# Patient Record
Sex: Female | Born: 1985 | Hispanic: No | Marital: Single | Smoking: Never smoker
Health system: Southern US, Community
[De-identification: ages and names within clinical notes are randomized; demographics above are authoritative.]

---
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
## 2011-06-02 ENCOUNTER — Emergency Department: Payer: Self-pay | Admitting: Internal Medicine

## 2012-12-06 ENCOUNTER — Emergency Department: Payer: Self-pay | Admitting: Emergency Medicine

## 2012-12-06 LAB — COMPREHENSIVE METABOLIC PANEL
Albumin: 3.4 g/dL (ref 3.4–5.0)
Alkaline Phosphatase: 53 U/L (ref 50–136)
BUN: 8 mg/dL (ref 7–18)
Co2: 23 mmol/L (ref 21–32)
Creatinine: 0.41 mg/dL — ABNORMAL LOW (ref 0.60–1.30)
SGPT (ALT): 18 U/L (ref 12–78)

## 2012-12-06 LAB — URINALYSIS, COMPLETE
Bilirubin,UR: NEGATIVE
Glucose,UR: NEGATIVE mg/dL (ref 0–75)
Ph: 6 (ref 4.5–8.0)
RBC,UR: <1 /HPF (ref 0–5)
Specific Gravity: 1.01 (ref 1.003–1.030)
WBC UR: 2 /HPF (ref 0–5)

## 2012-12-06 LAB — CBC
HGB: 12 g/dL (ref 12.0–16.0)
MCHC: 35 g/dL (ref 32.0–36.0)
MCV: 90 fL (ref 80–100)
Platelet: 278 10*3/uL (ref 150–440)

## 2012-12-06 LAB — HCG, QUANTITATIVE, PREGNANCY: Beta Hcg, Quant.: 36739 m[IU]/mL — ABNORMAL HIGH

## 2012-12-06 LAB — WET PREP, GENITAL

## 2012-12-07 LAB — GC/CHLAMYDIA PROBE AMP

## 2012-12-27 ENCOUNTER — Ambulatory Visit: Payer: Self-pay | Admitting: Family Medicine

## 2013-05-29 ENCOUNTER — Observation Stay: Payer: Self-pay | Admitting: Obstetrics and Gynecology

## 2013-06-02 ENCOUNTER — Inpatient Hospital Stay: Payer: Self-pay | Admitting: Obstetrics and Gynecology

## 2013-06-02 LAB — CBC WITH DIFFERENTIAL/PLATELET
BASOS PCT: 0.7 %
Basophil #: 0.1 10*3/uL (ref 0.0–0.1)
EOS ABS: 0.1 10*3/uL (ref 0.0–0.7)
EOS PCT: 1.1 %
HCT: 33 % — AB (ref 35.0–47.0)
HGB: 11.3 g/dL — ABNORMAL LOW (ref 12.0–16.0)
LYMPHS PCT: 25.4 %
Lymphocyte #: 1.8 10*3/uL (ref 1.0–3.6)
MCH: 30.6 pg (ref 26.0–34.0)
MCHC: 34.1 g/dL (ref 32.0–36.0)
MCV: 90 fL (ref 80–100)
Monocyte #: 0.5 x10 3/mm (ref 0.2–0.9)
Monocyte %: 7.1 %
Neutrophil #: 4.8 10*3/uL (ref 1.4–6.5)
Neutrophil %: 65.7 %
PLATELETS: 232 10*3/uL (ref 150–440)
RBC: 3.67 10*6/uL — ABNORMAL LOW (ref 3.80–5.20)
RDW: 14.3 % (ref 11.5–14.5)
WBC: 7.3 10*3/uL (ref 3.6–11.0)

## 2013-06-04 LAB — HEMATOCRIT: HCT: 29.1 % — ABNORMAL LOW (ref 35.0–47.0)

## 2014-08-18 NOTE — H&P (Signed)
L&D Evaluation:  History:  HPI 29yo G2P1001 with LMP of 08/19/12 & EDd of 05/26/13 here from [facility] for IOL secondary to post-dates IOL per Dr Feliberto GottronSchermerhorn. Pt had Cervidil placed last pm and this am, cx was 3/90/vtx-2. Pitocin was discussed and agreed and started. No ROM, no VB or decreased FM. Noted on the chart from 05/08/13 states abnormality in fetal heart rate or rhythm but, pt states she is unaware of any issues.   Presents with Post-dates IOL   Patient's Medical History No Chronic Illness   Patient's Surgical History none   Medications Pre Natal Vitamins   Allergies NKDA   Social History none   Family History Non-Contributory   ROS:  ROS All systems were reviewed.  HEENT, CNS, GI, GU, Respiratory, CV, Renal and Musculoskeletal systems were found to be normal.   Exam:  Vital Signs stable   General no apparent distress   Mental Status clear   Chest clear   Heart normal sinus rhythm, no murmur/gallop/rubs   Abdomen gravid, non-tender   Estimated Fetal Weight Average for gestational age   Fetal Position vtx   Back no CVAT   Reflexes 1+   Pelvic 3/100/vtx   Mebranes Intact   FHT normal rate with no decels   Ucx regular   Skin dry   Lymph no lymphadenopathy   Impression:  Impression active labor, reactive NST   Plan:  Plan monitor contractions and for cervical change   Electronic Signatures: Sharee PimpleJones, Francis Yardley W (CNM)  (Signed 24-Feb-15 16:41)  Authored: L&D Evaluation   Last Updated: 24-Feb-15 16:41 by Sharee PimpleJones, Lilyian Quayle W (CNM)

## 2014-09-01 ENCOUNTER — Other Ambulatory Visit: Payer: Self-pay | Admitting: Gastroenterology

## 2014-09-01 DIAGNOSIS — R1013 Epigastric pain: Secondary | ICD-10-CM

## 2014-09-01 DIAGNOSIS — R7989 Other specified abnormal findings of blood chemistry: Secondary | ICD-10-CM

## 2014-09-01 DIAGNOSIS — R945 Abnormal results of liver function studies: Principal | ICD-10-CM

## 2014-09-09 ENCOUNTER — Ambulatory Visit: Payer: Self-pay

## 2014-09-10 ENCOUNTER — Ambulatory Visit
Admission: RE | Admit: 2014-09-10 | Discharge: 2014-09-10 | Disposition: A | Discharge status: Home / Self Care | Payer: Self-pay | Source: Ambulatory Visit | Attending: Gastroenterology | Admitting: Gastroenterology

## 2014-09-10 ENCOUNTER — Ambulatory Visit: Payer: Self-pay

## 2014-09-10 DIAGNOSIS — R7989 Other specified abnormal findings of blood chemistry: Secondary | ICD-10-CM

## 2014-09-10 DIAGNOSIS — R1013 Epigastric pain: Secondary | ICD-10-CM | POA: Insufficient documentation

## 2014-09-10 DIAGNOSIS — R945 Abnormal results of liver function studies: Secondary | ICD-10-CM

## 2014-09-10 DIAGNOSIS — K760 Fatty (change of) liver, not elsewhere classified: Secondary | ICD-10-CM | POA: Insufficient documentation

## 2016-05-29 ENCOUNTER — Emergency Department
Admission: EM | Admit: 2016-05-29 | Discharge: 2016-05-30 | Disposition: A | Discharge status: Home / Self Care | Payer: Self-pay | Attending: Emergency Medicine | Admitting: Emergency Medicine

## 2016-05-29 ENCOUNTER — Encounter: Payer: Self-pay | Admitting: Intensive Care

## 2016-05-29 ENCOUNTER — Other Ambulatory Visit: Payer: Self-pay

## 2016-05-29 DIAGNOSIS — R1013 Epigastric pain: Secondary | ICD-10-CM | POA: Insufficient documentation

## 2016-05-29 DIAGNOSIS — R079 Chest pain, unspecified: Secondary | ICD-10-CM | POA: Insufficient documentation

## 2016-05-29 DIAGNOSIS — R0602 Shortness of breath: Secondary | ICD-10-CM | POA: Insufficient documentation

## 2016-05-29 LAB — COMPREHENSIVE METABOLIC PANEL
ALT: 30 U/L (ref 14–54)
ANION GAP: 7 (ref 5–15)
AST: 27 U/L (ref 15–41)
Albumin: 4.2 g/dL (ref 3.5–5.0)
Alkaline Phosphatase: 78 U/L (ref 38–126)
BUN: 16 mg/dL (ref 6–20)
CO2: 24 mmol/L (ref 22–32)
Calcium: 8.8 mg/dL — ABNORMAL LOW (ref 8.9–10.3)
Chloride: 107 mmol/L (ref 101–111)
Creatinine, Ser: 0.62 mg/dL (ref 0.44–1.00)
Glucose, Bld: 117 mg/dL — ABNORMAL HIGH (ref 65–99)
POTASSIUM: 3.4 mmol/L — AB (ref 3.5–5.1)
Sodium: 138 mmol/L (ref 135–145)
Total Bilirubin: 0.3 mg/dL (ref 0.3–1.2)
Total Protein: 8.3 g/dL — ABNORMAL HIGH (ref 6.5–8.1)

## 2016-05-29 LAB — URINALYSIS, COMPLETE (UACMP) WITH MICROSCOPIC
BACTERIA UA: NONE SEEN
BILIRUBIN URINE: NEGATIVE
Glucose, UA: NEGATIVE mg/dL
KETONES UR: NEGATIVE mg/dL
LEUKOCYTES UA: NEGATIVE
NITRITE: NEGATIVE
Protein, ur: NEGATIVE mg/dL
RBC / HPF: NONE SEEN RBC/hpf (ref 0–5)
SQUAMOUS EPITHELIAL / LPF: NONE SEEN
Specific Gravity, Urine: 1.002 — ABNORMAL LOW (ref 1.005–1.030)
pH: 7 (ref 5.0–8.0)

## 2016-05-29 LAB — CBC
HEMATOCRIT: 38.2 % (ref 35.0–47.0)
Hemoglobin: 12.6 g/dL (ref 12.0–16.0)
MCH: 29.2 pg (ref 26.0–34.0)
MCHC: 33.1 g/dL (ref 32.0–36.0)
MCV: 88.2 fL (ref 80.0–100.0)
Platelets: 345 10*3/uL (ref 150–440)
RBC: 4.33 MIL/uL (ref 3.80–5.20)
RDW: 14.4 % (ref 11.5–14.5)
WBC: 11.2 10*3/uL — AB (ref 3.6–11.0)

## 2016-05-29 LAB — LIPASE, BLOOD: LIPASE: 24 U/L (ref 11–51)

## 2016-05-29 LAB — TROPONIN I: Troponin I: <0.03 ng/mL (ref <0.03)

## 2016-05-29 MED ORDER — GI COCKTAIL ~~LOC~~
30.0000 mL | Freq: Once | ORAL | Status: AC
  Administered 2016-05-29: 30 mL via ORAL
  Filled 2016-05-29: qty 30

## 2016-05-29 NOTE — ED Notes (Signed)
Pt reports that she is having left arm pain for several weeks and chest pain that started this morning - periods of chills and shortness of breath started this am also

## 2016-05-29 NOTE — ED Triage Notes (Addendum)
Patient presents to ER with c/o discomfort in chest when breathing, SOB that started this afternoon, upper abdominal pain and L arm tingling/pain that radiates down L leg. A&O x3. Pt reports N/V/D on and off. NAD noted in triage

## 2016-05-30 ENCOUNTER — Emergency Department: Payer: Self-pay

## 2016-05-30 LAB — POCT PREGNANCY, URINE: PREG TEST UR: NEGATIVE

## 2016-05-30 LAB — FIBRIN DERIVATIVES D-DIMER (ARMC ONLY): FIBRIN DERIVATIVES D-DIMER (ARMC): 204 (ref 0–499)

## 2016-05-30 MED ORDER — RANITIDINE HCL 75 MG PO TABS: 75.0000 mg | ORAL_TABLET | Freq: Two times a day (BID) | ORAL | 0 refills | Status: AC

## 2016-05-30 NOTE — ED Provider Notes (Signed)
[facility] Emergency Department Provider Note  ____________________________________________   First MD Initiated Contact with Patient 05/29/16 2140     (approximate)  I have reviewed the triage vital signs and the nursing notes.   HISTORY  Chief Complaint Emesis; Shortness of Breath; and Abdominal Pain   HPI Shawna Russell is a 31 y.o. female without any chronic medical conditions who is presenting to the emergency department today with shortness of breath, chest pain as well as left arm pain earlier today while cleaning her house. She says that the pain feels like a pressure type pain and that she had vomited as well. Says that she is also having upper abdominal pain. She says that she has had increased reflux lately and that this may have been result of that. She says that she is feeling much improved now and without any nausea. Denies being on any hormone supplements. Denies any worsening of the pain with taking a deep breath.   History reviewed. No pertinent past medical history.  There are no active problems to display for this patient.   History reviewed. No pertinent surgical history.  Prior to Admission medications   Not on File    Allergies Patient has no known allergies.  History reviewed. No pertinent family history.  Social History Social History  Substance Use Topics  . Smoking status: Never Smoker  . Smokeless tobacco: Never Used  . Alcohol use No     Comment: occ    Review of Systems Constitutional: No fever/chills Eyes: No visual changes. ENT: No sore throat. Cardiovascular: as above Respiratory: as above Gastrointestinal: epigastric abd pain.   No diarrhea.  No constipation. Genitourinary: Negative for dysuria. Musculoskeletal: Negative for back pain. Skin: Negative for rash. Neurological: Negative for headaches, focal weakness or numbness.  10-point ROS otherwise  negative.  ____________________________________________   PHYSICAL EXAM:  VITAL SIGNS: ED Triage Vitals [05/29/16 2112]  Enc Vitals Group     BP 124/79     Pulse Rate (!) 107     Resp 20     Temp 98.6 F (37 C)     Temp Source Oral     SpO2 100 %     Weight 210 lb (95.3 kg)     Height 5\' 3"  (1.6 m)     Head Circumference      Peak Flow      Pain Score 8     Pain Loc      Pain Edu?      Excl. in GC?     Constitutional: Alert and oriented. Well appearing and in no acute distress. Eyes: Conjunctivae are normal. PERRL. EOMI. Head: Atraumatic. Nose: No congestion/rhinnorhea. Mouth/Throat: Mucous membranes are moist.   Neck: No stridor.   Cardiovascular:  Tachycardic, regular rhythm. Grossly normal heart sounds.  Chest pain is not reproduceable to palpation. Respiratory: Normal respiratory effort.  No retractions. Lungs CTAB. Gastrointestinal: Soft and nontender. No distention.  Musculoskeletal: No lower extremity tenderness nor edema.  No joint effusions. Neurologic:  Normal speech and language. No gross focal neurologic deficits are appreciated.  Skin:  Skin is warm, dry and intact. No rash noted. Psychiatric: Mood and affect are normal. Speech and behavior are normal.  ____________________________________________   LABS (all labs ordered are listed, but only abnormal results are displayed)  Labs Reviewed  COMPREHENSIVE METABOLIC PANEL - Abnormal; Notable for the following:       Result Value   Potassium 3.4 (*)    Glucose,  Bld 117 (*)    Calcium 8.8 (*)    Total Protein 8.3 (*)    All other components within normal limits  CBC - Abnormal; Notable for the following:    WBC 11.2 (*)    All other components within normal limits  URINALYSIS, COMPLETE (UACMP) WITH MICROSCOPIC - Abnormal; Notable for the following:    Color, Urine COLORLESS (*)    APPearance CLEAR (*)    Specific Gravity, Urine 1.002 (*)    Hgb urine dipstick SMALL (*)    All other components  within normal limits  LIPASE, BLOOD  TROPONIN I  FIBRIN DERIVATIVES D-DIMER ([facility] ONLY)   ____________________________________________  EKG  ED ECG REPORT I, Arelia LongestSchaevitz,  Aisling Emigh M, the attending physician, personally viewed and interpreted this ECG.   Date: 05/30/2016  EKG Time: 2122  Rate: 102  Rhythm: sinus tachycardia  Axis: normal  Intervals:none  ST&T Change: No ST segment elevation or depression. No abnormal T-wave inversion.  ____________________________________________  RADIOLOGY  No acute pathology to my read. ____________________________________________   PROCEDURES  Procedure(s) performed:   Procedures  Critical Care performed:   ____________________________________________   INITIAL IMPRESSION / ASSESSMENT AND PLAN / ED COURSE  Pertinent labs & imaging results that were available during my care of the patient were reviewed by me and considered in my medical decision making (see chart for details).  ----------------------------------------- 12:28 AM on 05/30/2016 -----------------------------------------  Patient able to relax and heart rate down to 104. Patient says that she feels much improved and feels very anxious and this may be why her heart rate is very high. She says that she does not like going to the hospital. She is largely symptomatic at this point with a very reassuring workup. We will discharge her with Zantac. She'll be following with her primary care doctor. She has no further questions. She is understanding the plan for follow-up as well as the plan for in antacid and willing to comply. D-dimer. Very low risk for heart disease.      ____________________________________________   FINAL CLINICAL IMPRESSION(S) / ED DIAGNOSES  Chest pain. Shortness of breath.    NEW MEDICATIONS STARTED DURING THIS VISIT:  New Prescriptions   No medications on file     Note:  This document was prepared using Dragon voice recognition software  and may include unintentional dictation errors.    Myrna Blazeravid Matthew Leeana Creer, MD 05/30/16 Jacinta [id number]

## 2016-05-30 NOTE — ED Notes (Signed)
Pt transported to xray via wheel chair

## 2016-11-17 ENCOUNTER — Emergency Department: Payer: Self-pay

## 2016-11-17 ENCOUNTER — Emergency Department
Admission: EM | Admit: 2016-11-17 | Discharge: 2016-11-17 | Disposition: A | Discharge status: Home / Self Care | Payer: Self-pay | Attending: Emergency Medicine | Admitting: Emergency Medicine

## 2016-11-17 ENCOUNTER — Encounter: Payer: Self-pay | Admitting: Emergency Medicine

## 2016-11-17 DIAGNOSIS — N200 Calculus of kidney: Secondary | ICD-10-CM

## 2016-11-17 DIAGNOSIS — Z79899 Other long term (current) drug therapy: Secondary | ICD-10-CM | POA: Insufficient documentation

## 2016-11-17 DIAGNOSIS — R1011 Right upper quadrant pain: Secondary | ICD-10-CM | POA: Insufficient documentation

## 2016-11-17 DIAGNOSIS — R109 Unspecified abdominal pain: Secondary | ICD-10-CM

## 2016-11-17 DIAGNOSIS — R1031 Right lower quadrant pain: Secondary | ICD-10-CM | POA: Insufficient documentation

## 2016-11-17 DIAGNOSIS — R300 Dysuria: Secondary | ICD-10-CM | POA: Insufficient documentation

## 2016-11-17 LAB — CBC
HEMATOCRIT: 38.6 % (ref 35.0–47.0)
HEMOGLOBIN: 12.9 g/dL (ref 12.0–16.0)
MCH: 29.5 pg (ref 26.0–34.0)
MCHC: 33.4 g/dL (ref 32.0–36.0)
MCV: 88.3 fL (ref 80.0–100.0)
Platelets: 309 10*3/uL (ref 150–440)
RBC: 4.37 MIL/uL (ref 3.80–5.20)
RDW: 14.3 % (ref 11.5–14.5)
WBC: 7.4 10*3/uL (ref 3.6–11.0)

## 2016-11-17 LAB — COMPREHENSIVE METABOLIC PANEL
ALBUMIN: 4.2 g/dL (ref 3.5–5.0)
ALT: 30 U/L (ref 14–54)
ANION GAP: 9 (ref 5–15)
AST: 24 U/L (ref 15–41)
Alkaline Phosphatase: 76 U/L (ref 38–126)
BUN: 18 mg/dL (ref 6–20)
CHLORIDE: 105 mmol/L (ref 101–111)
CO2: 25 mmol/L (ref 22–32)
Calcium: 8.9 mg/dL (ref 8.9–10.3)
Creatinine, Ser: 0.76 mg/dL (ref 0.44–1.00)
GFR calc Af Amer: >60 mL/min (ref >60)
GFR calc non Af Amer: >60 mL/min (ref >60)
GLUCOSE: 134 mg/dL — AB (ref 65–99)
POTASSIUM: 3.4 mmol/L — AB (ref 3.5–5.1)
SODIUM: 139 mmol/L (ref 135–145)
Total Bilirubin: 0.5 mg/dL (ref 0.3–1.2)
Total Protein: 8.2 g/dL — ABNORMAL HIGH (ref 6.5–8.1)

## 2016-11-17 LAB — URINALYSIS, COMPLETE (UACMP) WITH MICROSCOPIC
BACTERIA UA: NONE SEEN
BILIRUBIN URINE: NEGATIVE
Glucose, UA: NEGATIVE mg/dL
Ketones, ur: NEGATIVE mg/dL
NITRITE: NEGATIVE
Protein, ur: NEGATIVE mg/dL
Specific Gravity, Urine: 1.013 (ref 1.005–1.030)
pH: 7 (ref 5.0–8.0)

## 2016-11-17 LAB — POCT PREGNANCY, URINE: PREG TEST UR: NEGATIVE

## 2016-11-17 LAB — LIPASE, BLOOD: LIPASE: 29 U/L (ref 11–51)

## 2016-11-17 MED ORDER — CEFTRIAXONE SODIUM 1 G IJ SOLR
1.0000 g | INTRAMUSCULAR | Status: DC
  Administered 2016-11-17: 1 g via INTRAVENOUS
  Filled 2016-11-17: qty 10

## 2016-11-17 MED ORDER — KETOROLAC TROMETHAMINE 30 MG/ML IJ SOLN
30.0000 mg | Freq: Once | INTRAMUSCULAR | Status: AC
  Administered 2016-11-17: 30 mg via INTRAVENOUS
  Filled 2016-11-17: qty 1

## 2016-11-17 MED ORDER — ONDANSETRON 4 MG PO TBDP: 4.0000 mg | ORAL_TABLET | Freq: Four times a day (QID) | ORAL | 0 refills | Status: AC | PRN

## 2016-11-17 MED ORDER — HYDROCODONE-ACETAMINOPHEN 5-325 MG PO TABS: 1.0000 | ORAL_TABLET | Freq: Four times a day (QID) | ORAL | 0 refills | Status: AC | PRN

## 2016-11-17 MED ORDER — SODIUM CHLORIDE 0.9 % IV BOLUS (SEPSIS)
1000.0000 mL | Freq: Once | INTRAVENOUS | Status: AC
  Administered 2016-11-17: 1000 mL via INTRAVENOUS

## 2016-11-17 MED ORDER — ONDANSETRON HCL 4 MG/2ML IJ SOLN
4.0000 mg | Freq: Once | INTRAMUSCULAR | Status: AC
  Administered 2016-11-17: 4 mg via INTRAVENOUS
  Filled 2016-11-17: qty 2

## 2016-11-17 MED ORDER — CEPHALEXIN 500 MG PO CAPS: 500.0000 mg | ORAL_CAPSULE | Freq: Four times a day (QID) | ORAL | 0 refills | Status: AC

## 2016-11-17 NOTE — ED Triage Notes (Signed)
Patient ambulatory to triage with steady gait, without difficulty or distress noted; pt reports awoke with right lower abd pain radiating into back with no accomp symptoms; denies hx of same

## 2016-11-17 NOTE — ED Notes (Signed)
Patient transported to CT 

## 2016-11-17 NOTE — ED Provider Notes (Signed)
Ct Renal Stone Study  Result Date: 11/17/2016 CLINICAL DATA:  Right-sided flank pain beginning this morning. EXAM: CT ABDOMEN AND PELVIS WITHOUT CONTRAST TECHNIQUE: Multidetector CT imaging of the abdomen and pelvis was performed following the standard protocol without IV contrast. COMPARISON:  None. FINDINGS: Lower chest:  No contributory findings. Hepatobiliary: Hepatic steatosis.No evidence of biliary obstruction or stone. Pancreas: Unremarkable. Spleen: Unremarkable. Adrenals/Urinary Tract: Negative adrenals. Asymmetric right perinephric stranding and visible ureteral thickening. The right ureter is patulous. No visible stone. No hydronephrosis. Unremarkable bladder. Stomach/Bowel:  No obstruction. No appendicitis. Vascular/Lymphatic: No acute vascular abnormality. No mass or adenopathy. Reproductive:The left ovary is larger than the right within physiologic range. There is a paraovarian cystic density structure about the left ovary measuring 18 mm. Other: No ascites or pneumoperitoneum. Musculoskeletal: There is an ovoid lucency in the right ilium measuring up to 2 cm in length. On coronal reformats this appears to have a channel like communication with the sacroiliac joint which is non eroded. The lucency has well-defined margins and has an overall benign appearance. There is a similar but even smaller subchondral lucency in the left ilium. Remote T7 compression fracture with moderate to advanced height loss. No retropulsion. Remote left inferior pubic ramus fracture. Subtle lucency at the in the right superior pubic ramus could also be posttraumatic. IMPRESSION: 1. Right perinephric and periureteric edema without visible obstructive process. This could reflect a recently passed stone or ascending infection with pyelonephritis. 2. Remote T7 compression fracture. Remote left inferior pubic ramus fracture. 3. Bilateral iliac bone lucencies, likely subchondral cysts. Electronically Signed   By: Marnee SpringJonathon  Watts  M.D.   On: 11/17/2016 08:34   ----------------------------------------- 10:00 AM on 11/17/2016 ----------------------------------------- Patient resting, reports pain is much better. Discussed with patient, appears likely she passed a kidney stone but given the associated CT findings I we'll place her on antibiotics preventatively. She does not appear to have any acute infectious symptoms, no fever, no white count, and reports pain and symptoms  are better.  I will prescribe the patient a narcotic pain medicine due to their condition which I anticipate will cause at least moderate pain short term. I discussed with the patient safe use of narcotic pain medicines, and that they are not to drive, work in dangerous areas, or ever take more than prescribed (no more than 1 pill every 6 hours). We discussed that this is the type of medication that can be  overdosed on and the risks of this type of medicine. Patient is very agreeable to only use as prescribed and to never use more than prescribed.   Return precautions and treatment recommendations and follow-up discussed with the patient who is agreeable with the plan.      Sharyn CreamerQuale, Mark, MD 11/17/16 1041

## 2016-11-17 NOTE — ED Provider Notes (Signed)
[facility] Emergency Department Provider Note   ____________________________________________   First MD Initiated Contact with Patient 11/17/16 [phone]     (approximate)  I have reviewed the triage vital signs and the nursing notes.   HISTORY  Chief Complaint Abdominal Pain    HPI Shawna Russell is a 31 y.o. female who presents to the ED from home with a chief complaint of right flank and abdominal pain. Patient awoke prior to arrival with right flank pain radiating into her right lower abdomen. Symptoms not associated with fever, chills, chest pain, shortness of breath, nausea, vomiting, hematuria. Denies vaginal discharge. Complains of some dysuria. Denies recent travel or trauma. Nothing makes her symptoms better or worse.   Past medical history None  There are no active problems to display for this patient.   History reviewed. No pertinent surgical history.  Prior to Admission medications   Medication Sig Start Date End Date Taking? Authorizing Provider  ranitidine (ZANTAC 75) 75 MG tablet Take 1 tablet (75 mg total) by mouth 2 (two) times daily. 05/30/16   Schaevitz, Myra Rudeavid Matthew, MD    Allergies Patient has no known allergies.  No family history on file.  Social History Social History  Substance Use Topics  . Smoking status: Never Smoker  . Smokeless tobacco: Never Used  . Alcohol use No     Comment: occ    Review of Systems  Constitutional: No fever/chills. Eyes: No visual changes. ENT: No sore throat. Cardiovascular: Denies chest pain. Respiratory: Denies shortness of breath. Gastrointestinal: Positive for right flank and abdominal pain.  No nausea, no vomiting.  No diarrhea.  No constipation. Genitourinary: Positive for dysuria. Musculoskeletal: Negative for back pain. Skin: Negative for rash. Neurological: Negative for headaches, focal weakness or  numbness.   ____________________________________________   PHYSICAL EXAM:  VITAL SIGNS: ED Triage Vitals  Enc Vitals Group     BP 11/17/16 0544 136/86     Pulse Rate 11/17/16 0544 93     Resp 11/17/16 0544 18     Temp 11/17/16 0544 98 F (36.7 C)     Temp Source 11/17/16 0544 Oral     SpO2 11/17/16 0544 100 %     Weight 11/17/16 0543 200 lb (90.7 kg)     Height 11/17/16 0543 5\' 3"  (1.6 m)     Head Circumference --      Peak Flow --      Pain Score 11/17/16 0543 10     Pain Loc --      Pain Edu? --      Excl. in GC? --     Constitutional: Alert and oriented. Well appearing and in mild acute distress. Eyes: Conjunctivae are normal. PERRL. EOMI. Head: Atraumatic. Nose: No congestion/rhinnorhea. Mouth/Throat: Mucous membranes are moist.  Oropharynx non-erythematous. Neck: No stridor.   Cardiovascular: Normal rate, regular rhythm. Grossly normal heart sounds.  Good peripheral circulation. Respiratory: Normal respiratory effort.  No retractions. Lungs CTAB. Gastrointestinal: Soft and mildly tender to palpation right lower quadrant without rebound or guarding. No distention. No abdominal bruits. No CVA tenderness. Musculoskeletal: No lower extremity tenderness nor edema.  No joint effusions. Neurologic:  Normal speech and language. No gross focal neurologic deficits are appreciated. No gait instability. Skin:  Skin is warm, dry and intact. No rash noted. Psychiatric: Mood and affect are normal. Speech and behavior are normal.  ____________________________________________   LABS (all labs ordered are listed, but only abnormal results are displayed)  Labs Reviewed  COMPREHENSIVE METABOLIC PANEL - Abnormal; Notable for the following:       Result Value   Potassium 3.4 (*)    Glucose, Bld 134 (*)    Total Protein 8.2 (*)    All other components within normal limits  URINALYSIS, COMPLETE (UACMP) WITH MICROSCOPIC - Abnormal; Notable for the following:    Color, Urine STRAW  (*)    APPearance CLEAR (*)    Hgb urine dipstick SMALL (*)    Leukocytes, UA TRACE (*)    Squamous Epithelial / LPF 0-5 (*)    All other components within normal limits  LIPASE, BLOOD  CBC  POC URINE PREG, ED  POCT PREGNANCY, URINE   ____________________________________________  EKG  None ____________________________________________  RADIOLOGY  No results found.  ____________________________________________   PROCEDURES  Procedure(s) performed: None  Procedures  Critical Care performed: No  ____________________________________________   INITIAL IMPRESSION / ASSESSMENT AND PLAN / ED COURSE  Pertinent labs & imaging results that were available during my care of the patient were reviewed by me and considered in my medical decision making (see chart for details).  31 year old female who was awakened from sleep with right flank to right lower quadrant abdominal pain. Symptoms suggestive of kidney stone. Will obtain screening lab work, urinalysis and CT renal colic study. Will initiate IV fluid resuscitation, analgesia with antiemetic.  Clinical Course as of Nov 17 712  Fri Nov 17, 2016  1610 Care transferred to Dr. Fanny Bien pending results of CT scan.  [JS]    Clinical Course User Index [JS] Irean Hong, MD     ____________________________________________   FINAL CLINICAL IMPRESSION(S) / ED DIAGNOSES  Final diagnoses:  Flank pain  Right lower quadrant abdominal pain      NEW MEDICATIONS STARTED DURING THIS VISIT:  New Prescriptions   No medications on file     Note:  This document was prepared using Dragon voice recognition software and may include unintentional dictation errors.    Irean Hong, MD 11/17/16 [phone]

## 2016-11-17 NOTE — Discharge Instructions (Signed)
You have been seen in the Emergency Department (ED) today for pain that we believe based on your workup, is caused by kidney stones.  As we have discussed, please drink plenty of fluids.  Please make a follow up appointment with the physician(s) listed elsewhere in this documentation. ° °You may take pain medication as needed but ONLY as prescribed.  Please also take your prescribed Flomax daily.  We also recommend that you take over-the-counter ibuprofen regularly according to label instructions over the next 5 days.  Take it with meals to minimize stomach discomfort. ° °Please see your doctor as soon as possible as stones may take 1-3 weeks to pass and you may require additional care or medications. ° °Do not drink alcohol, drive or participate in any other potentially dangerous activities while taking opiate pain medication as it may make you sleepy. Do not take this medication with any other sedating medications, either prescription or over-the-counter. If you were prescribed Percocet or Vicodin, do not take these with acetaminophen (Tylenol) as it is already contained within these medications. °  °This medication is an opiate (or narcotic) pain medication and can be habit forming.  Use it as little as possible to achieve adequate pain control.  Do not use or use it with extreme caution if you have a history of opiate abuse or dependence.  If you are on a pain contract with your primary care doctor or a pain specialist, be sure to let them know you were prescribed this medication today from the [facility].  This medication is intended for your use only - do not give any to anyone else and keep it in a secure place where nobody else, especially children, have access to it.  It will also cause or worsen constipation, so you may want to consider taking an over-the-counter stool softener while you are taking this medication. ° °Return to the Emergency Department (ED) or call your doctor  if you have any worsening pain, fever, painful urination, are unable to urinate, or develop other symptoms that concern you. ° °

## 2017-06-12 ENCOUNTER — Encounter: Payer: Self-pay | Admitting: Emergency Medicine

## 2017-06-12 ENCOUNTER — Emergency Department
Admission: EM | Admit: 2017-06-12 | Discharge: 2017-06-12 | Disposition: A | Discharge status: Home / Self Care | Payer: Self-pay | Attending: Emergency Medicine | Admitting: Emergency Medicine

## 2017-06-12 ENCOUNTER — Emergency Department: Payer: Self-pay

## 2017-06-12 DIAGNOSIS — R1010 Upper abdominal pain, unspecified: Secondary | ICD-10-CM | POA: Insufficient documentation

## 2017-06-12 DIAGNOSIS — K219 Gastro-esophageal reflux disease without esophagitis: Secondary | ICD-10-CM | POA: Insufficient documentation

## 2017-06-12 LAB — COMPREHENSIVE METABOLIC PANEL
ALT: 44 U/L (ref 14–54)
AST: 36 U/L (ref 15–41)
Albumin: 4.3 g/dL (ref 3.5–5.0)
Alkaline Phosphatase: 75 U/L (ref 38–126)
Anion gap: 11 (ref 5–15)
BUN: 10 mg/dL (ref 6–20)
CHLORIDE: 106 mmol/L (ref 101–111)
CO2: 23 mmol/L (ref 22–32)
CREATININE: 0.53 mg/dL (ref 0.44–1.00)
Calcium: 9.1 mg/dL (ref 8.9–10.3)
Glucose, Bld: 110 mg/dL — ABNORMAL HIGH (ref 65–99)
POTASSIUM: 3.7 mmol/L (ref 3.5–5.1)
SODIUM: 140 mmol/L (ref 135–145)
Total Bilirubin: 0.6 mg/dL (ref 0.3–1.2)
Total Protein: 8.9 g/dL — ABNORMAL HIGH (ref 6.5–8.1)

## 2017-06-12 LAB — CBC
HEMATOCRIT: 42.4 % (ref 35.0–47.0)
Hemoglobin: 14.3 g/dL (ref 12.0–16.0)
MCH: 28.9 pg (ref 26.0–34.0)
MCHC: 33.7 g/dL (ref 32.0–36.0)
MCV: 85.8 fL (ref 80.0–100.0)
PLATELETS: 289 10*3/uL (ref 150–440)
RBC: 4.94 MIL/uL (ref 3.80–5.20)
RDW: 14.7 % — ABNORMAL HIGH (ref 11.5–14.5)
WBC: 6.1 10*3/uL (ref 3.6–11.0)

## 2017-06-12 LAB — POCT PREGNANCY, URINE: PREG TEST UR: NEGATIVE

## 2017-06-12 LAB — URINALYSIS, COMPLETE (UACMP) WITH MICROSCOPIC
BACTERIA UA: NONE SEEN
Bilirubin Urine: NEGATIVE
Glucose, UA: NEGATIVE mg/dL
KETONES UR: NEGATIVE mg/dL
LEUKOCYTES UA: NEGATIVE
Nitrite: NEGATIVE
PROTEIN: NEGATIVE mg/dL
Specific Gravity, Urine: 1.01 (ref 1.005–1.030)
pH: 6 (ref 5.0–8.0)

## 2017-06-12 LAB — LIPASE, BLOOD: Lipase: 22 U/L (ref 11–51)

## 2017-06-12 MED ORDER — GI COCKTAIL ~~LOC~~
30.0000 mL | Freq: Once | ORAL | Status: AC
  Administered 2017-06-12: 30 mL via ORAL
  Filled 2017-06-12: qty 30

## 2017-06-12 MED ORDER — SUCRALFATE 1 G PO TABS: 1.0000 g | ORAL_TABLET | Freq: Four times a day (QID) | ORAL | 1 refills | Status: DC

## 2017-06-12 NOTE — ED Triage Notes (Signed)
Pt reports pain to mid epigastric area and LUQ since yesterday. Pt reports pain was intermittent but is now constant. Pt also with low grade fever. Reports nausea but denies all other symptoms.

## 2017-06-12 NOTE — ED Triage Notes (Signed)
First Nurse Note:  [facility] abominal pain x 1 day.  Also c/o nausea.  No emesis.

## 2017-06-12 NOTE — ED Provider Notes (Signed)
[facility] Emergency Department Provider Note       Time seen: ----------------------------------------- 12:45 PM on 06/12/2017 -----------------------------------------   I have reviewed the triage vital signs and the nursing notes.  HISTORY   Chief Complaint Abdominal Pain and Nausea    HPI Shawna Russell is a 32 y.o. female with a history of GERD who presents to the ED for upper quadrant pain since yesterday.  Patient reports pain was intermittent but is now constant.  She thinks she has had a low-grade fever and nausea but denies any other symptoms.  She is not sure when her last bowel movement was.    History reviewed. No pertinent past medical history.  There are no active problems to display for this patient.   History reviewed. No pertinent surgical history.  Allergies Patient has no known allergies.  Social History Social History   Tobacco Use  . Smoking status: Never Smoker  . Smokeless tobacco: Never Used  Substance Use Topics  . Alcohol use: No    Comment: occ  . Drug use: Not on file    Review of Systems Constitutional: Negative for fever. Cardiovascular: Negative for chest pain. Respiratory: Negative for shortness of breath. Gastrointestinal: Positive for abdominal pain, possible constipation Genitourinary: Negative for dysuria. Musculoskeletal: Negative for back pain. Skin: Negative for rash. Neurological: Negative for headaches, focal weakness or numbness.  All systems negative/normal/unremarkable except as stated in the HPI  ____________________________________________   PHYSICAL EXAM:  VITAL SIGNS: ED Triage Vitals  Enc Vitals Group     BP 06/12/17 1025 (!) 148/73     Pulse Rate 06/12/17 1025 98     Resp 06/12/17 1025 20     Temp 06/12/17 1025 99 F (37.2 C)     Temp Source 06/12/17 1025 Oral     SpO2 06/12/17 1025 97 %     Weight 06/12/17 1026 209 lb (94.8 kg)     Height 06/12/17 1026 5\' 1"  (1.549  m)     Head Circumference --      Peak Flow --      Pain Score 06/12/17 1026 8     Pain Loc --      Pain Edu? --      Excl. in GC? --    Constitutional: Alert and oriented. Well appearing and in no distress. Eyes: Conjunctivae are normal. Normal extraocular movements. ENT   Head: Normocephalic and atraumatic.   Nose: No congestion/rhinnorhea.   Mouth/Throat: Mucous membranes are moist.   Neck: No stridor. Cardiovascular: Normal rate, regular rhythm. No murmurs, rubs, or gallops. Respiratory: Normal respiratory effort without tachypnea nor retractions. Breath sounds are clear and equal bilaterally. No wheezes/rales/rhonchi. Gastrointestinal: Mild epigastric and left upper quadrant tenderness, no rebound or guarding.  Normal bowel sounds. Musculoskeletal: Nontender with normal range of motion in extremities. No lower extremity tenderness nor edema. Neurologic:  Normal speech and language. No gross focal neurologic deficits are appreciated.  Skin:  Skin is warm, dry and intact. No rash noted. Psychiatric: Mood and affect are normal. Speech and behavior are normal.  ____________________________________________  ED COURSE:  As part of my medical decision making, I reviewed the following data within the electronic MEDICAL RECORD NUMBER History obtained from family if available, nursing notes, old chart and ekg, as well as notes from prior ED visits. Patient presented for abdominal pain, we will assess with labs and imaging as indicated at this time.   Procedures ____________________________________________   LABS (pertinent positives/negatives)  Labs  Reviewed  COMPREHENSIVE METABOLIC PANEL - Abnormal; Notable for the following components:      Result Value   Glucose, Bld 110 (*)    Total Protein 8.9 (*)    All other components within normal limits  CBC - Abnormal; Notable for the following components:   RDW 14.7 (*)    All other components within normal limits  URINALYSIS,  COMPLETE (UACMP) WITH MICROSCOPIC - Abnormal; Notable for the following components:   Color, Urine STRAW (*)    APPearance CLEAR (*)    Hgb urine dipstick SMALL (*)    Squamous Epithelial / LPF 0-5 (*)    All other components within normal limits  LIPASE, BLOOD  POC URINE PREG, ED  POCT PREGNANCY, URINE    RADIOLOGY Images reviewed by me Abdomen 2 view Is unremarkable ____________________________________________  DIFFERENTIAL DIAGNOSIS   GERD, peptic ulcer disease, constipation, gas pain  FINAL ASSESSMENT AND PLAN  Abdominal pain, nausea   Plan: The patient had presented for abdominal pain and nausea. Patient's labs are unremarkable. Patient's imaging was unremarkable.  This is likely GERD or peptic ulcer disease related.  She will be advised to take her omeprazole daily as well as I will add Carafate.  She is stable for outpatient follow-up.   Ulice DashJohnathan E Muhammed Teutsch, MD   Note: This note was generated in part or whole with voice recognition software. Voice recognition is usually quite accurate but there are transcription errors that can and very often do occur. I apologize for any typographical errors that were not detected and corrected.     Emily FilbertWilliams, Brylyn Novakovich E, MD 06/12/17 1331

## 2018-03-01 IMAGING — CR DG CHEST 2V
2 series · 2 of 2 positions shown · non-contrast
Comparison: None.

CLINICAL DATA: 30-year-old female with chest pain.

EXAM:
CHEST  2 VIEW

[chest pa]
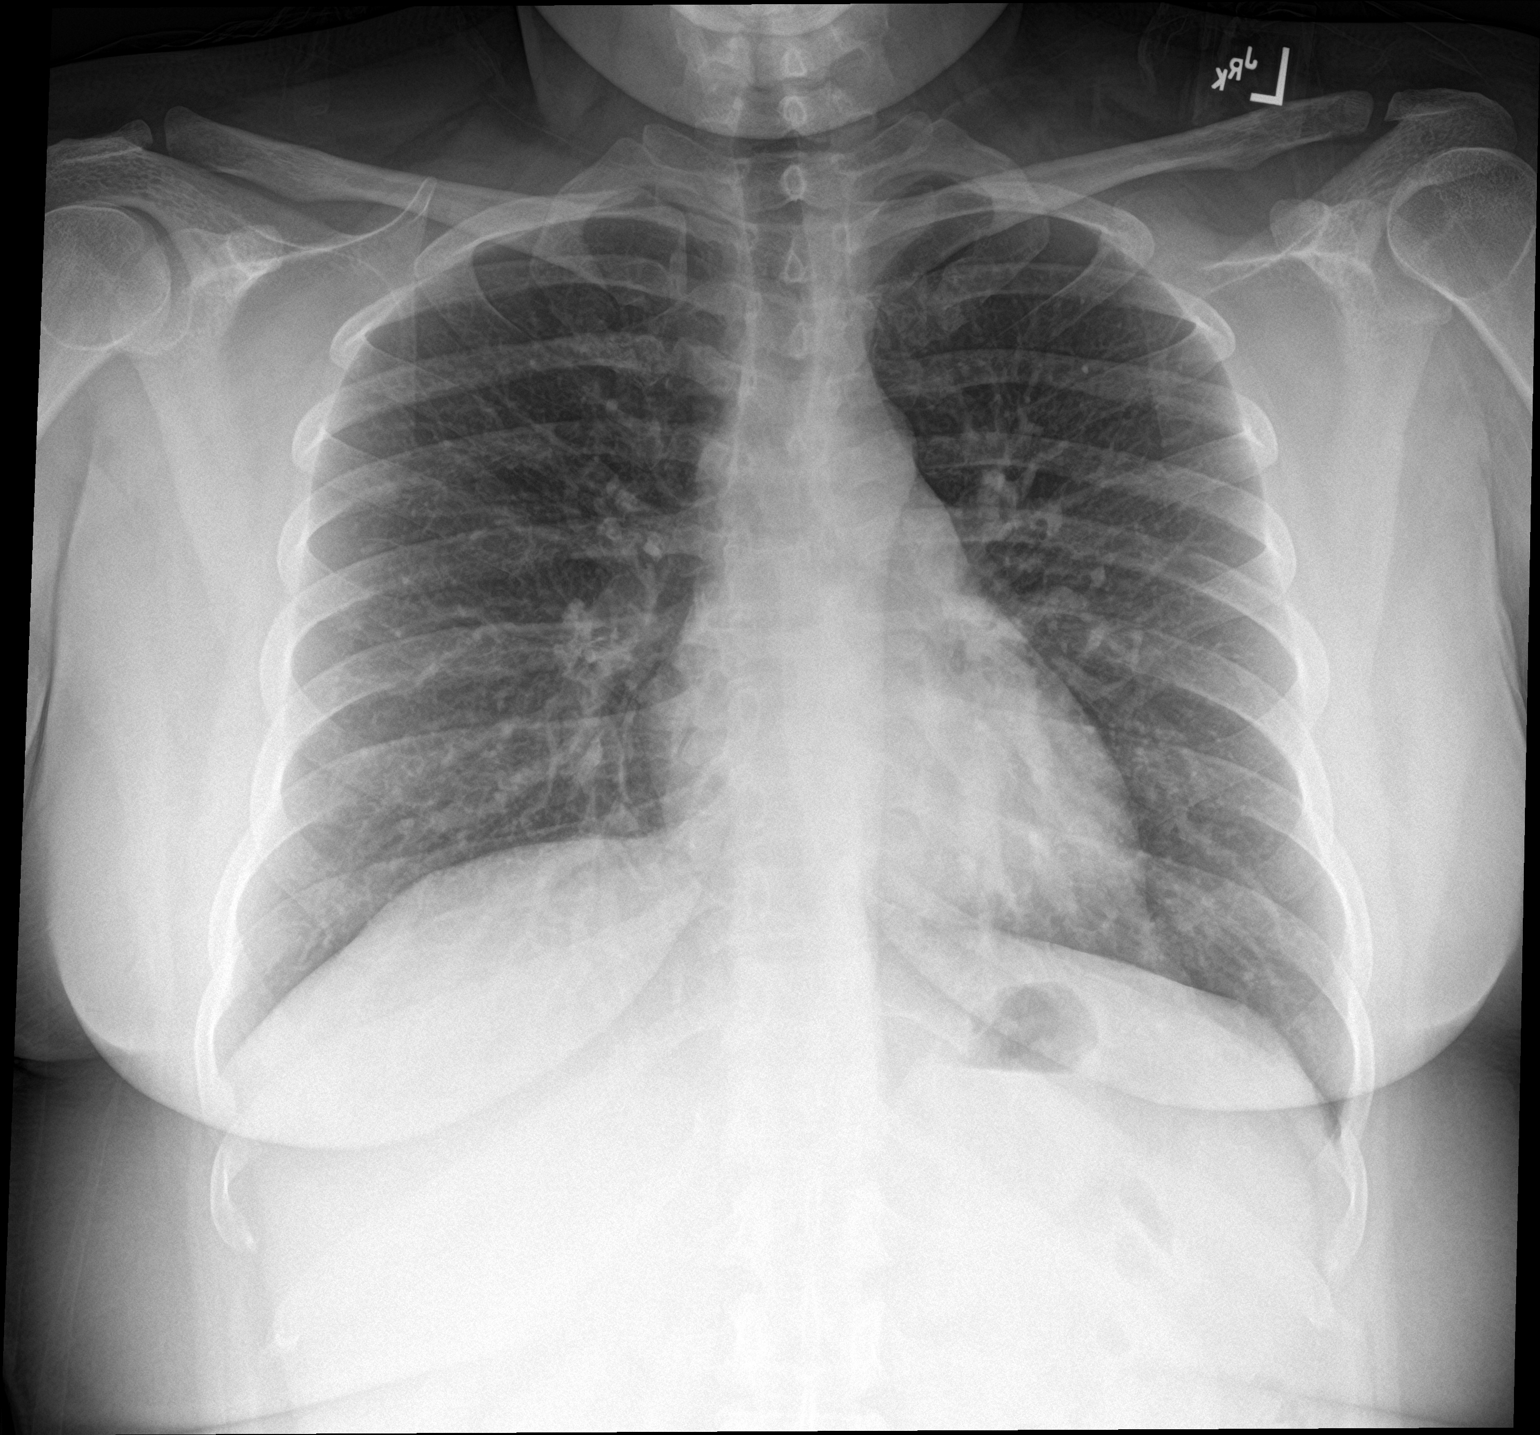

[chest lat]
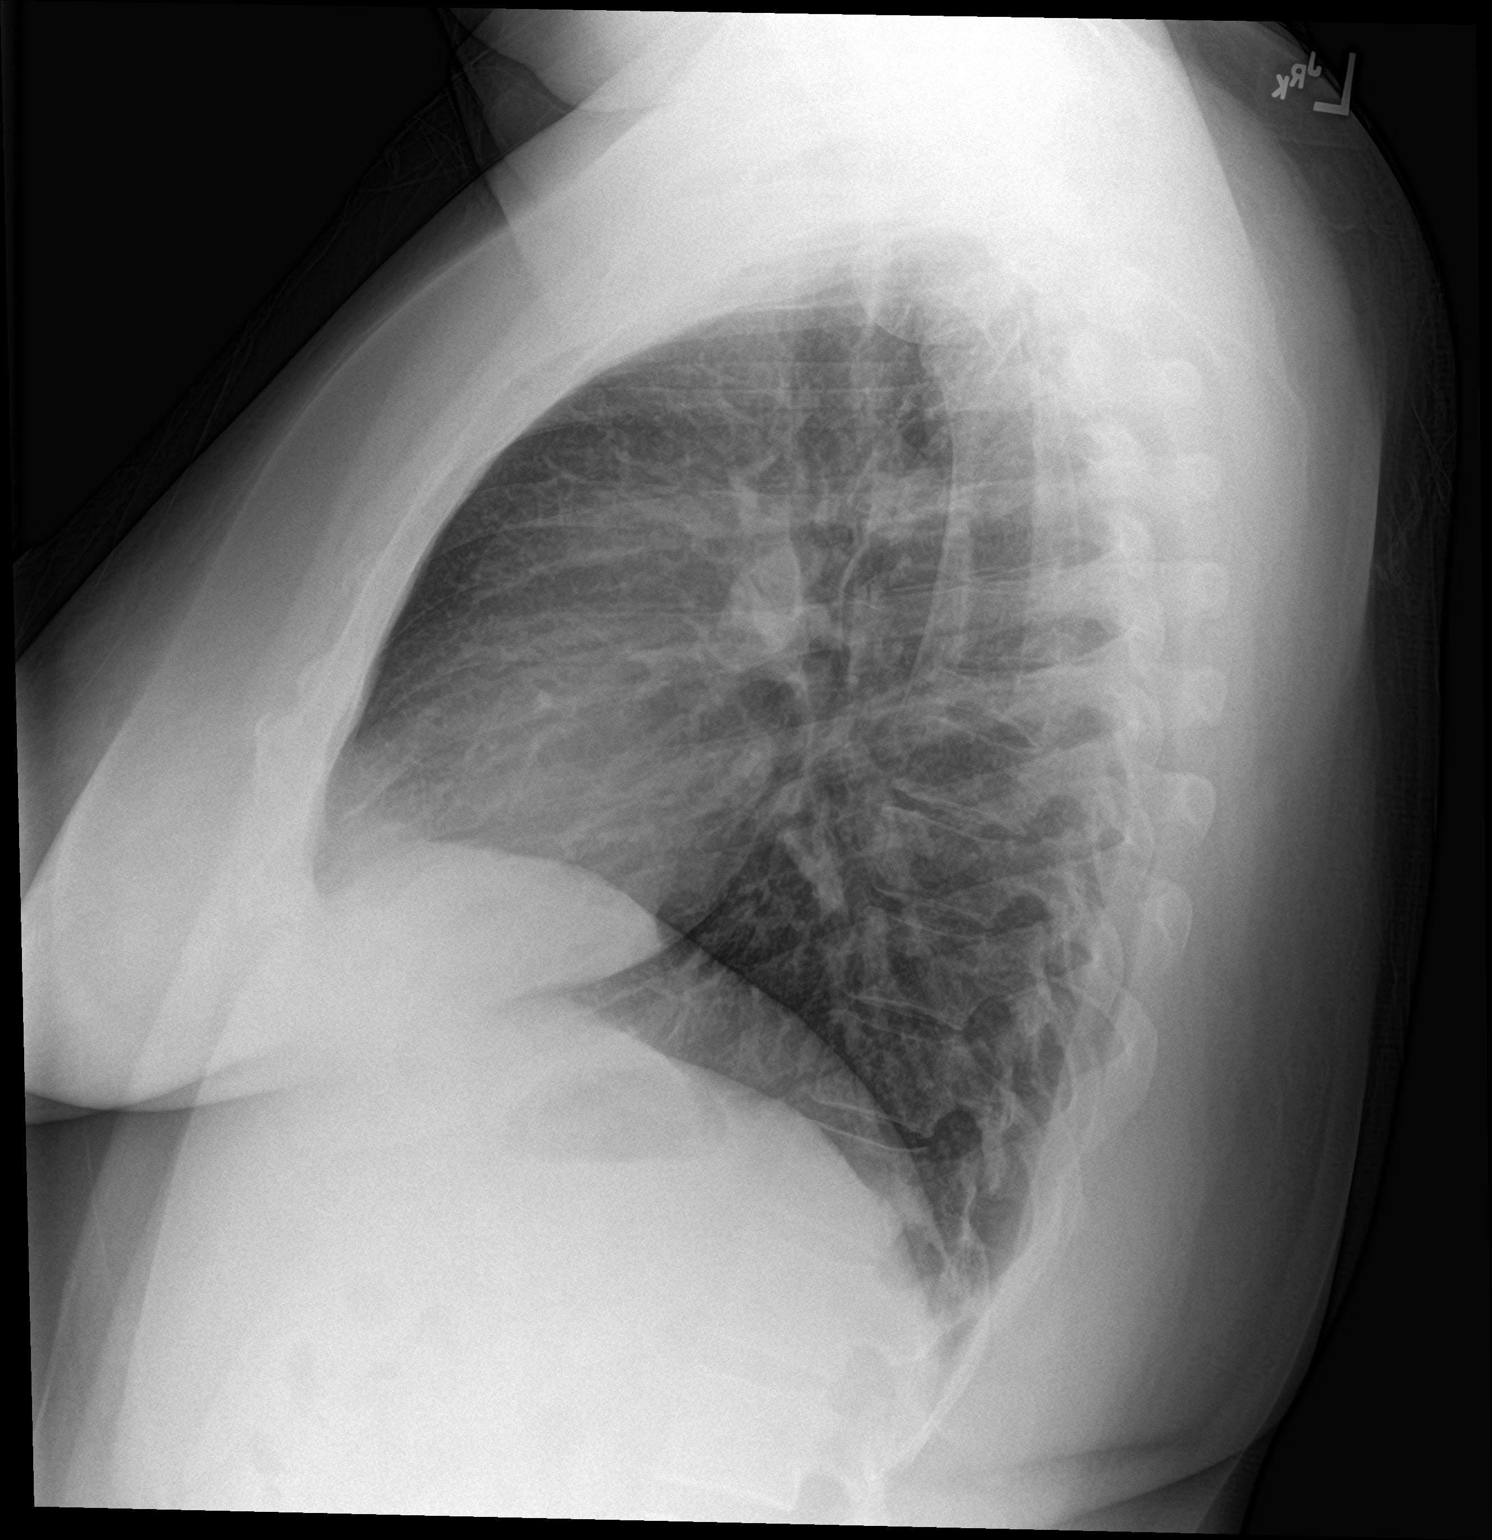

[2 of 2 positions shown; findings below may reference images not displayed]

FINDINGS: The heart size and mediastinal contours are within normal limits.
Both lungs are clear. The visualized skeletal structures are
unremarkable.
IMPRESSION: No active cardiopulmonary disease.

## 2019-03-14 IMAGING — CR DG ABDOMEN 2V
3 series · 3 of 3 positions shown · non-contrast
Comparison: CT scan of November 17, 2016.

CLINICAL DATA: Acute epigastric abdominal pain.

EXAM:
ABDOMEN - 2 VIEW

[abdomen erect]
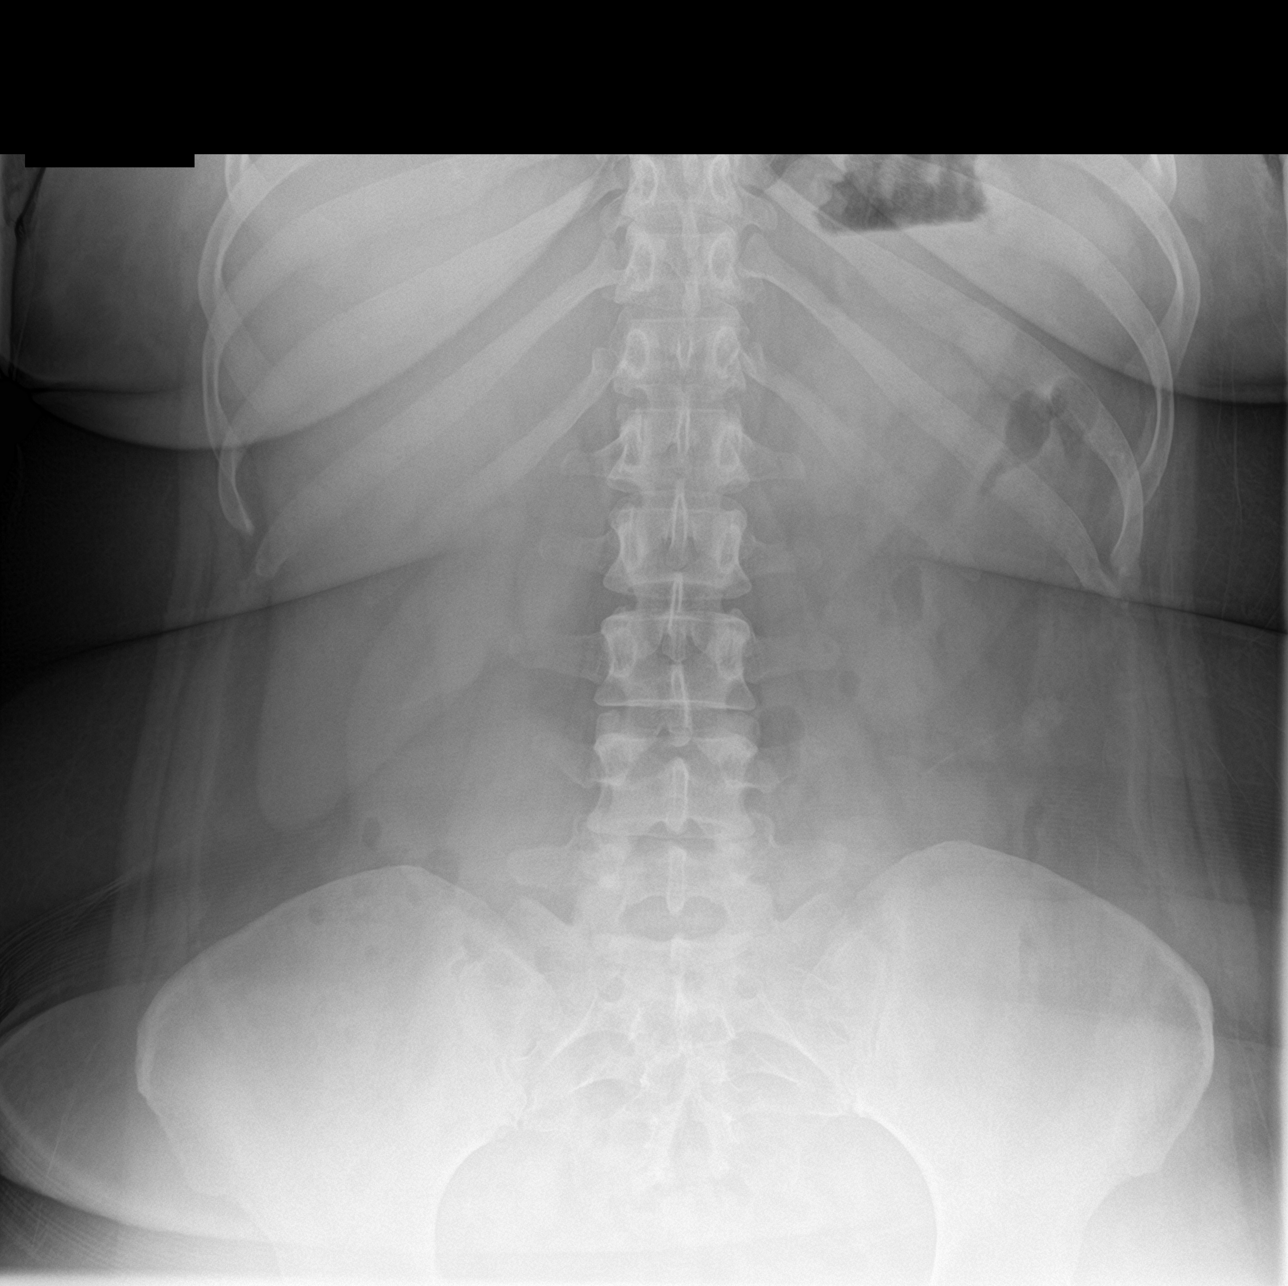

[abdomen supine (1 of 2)]
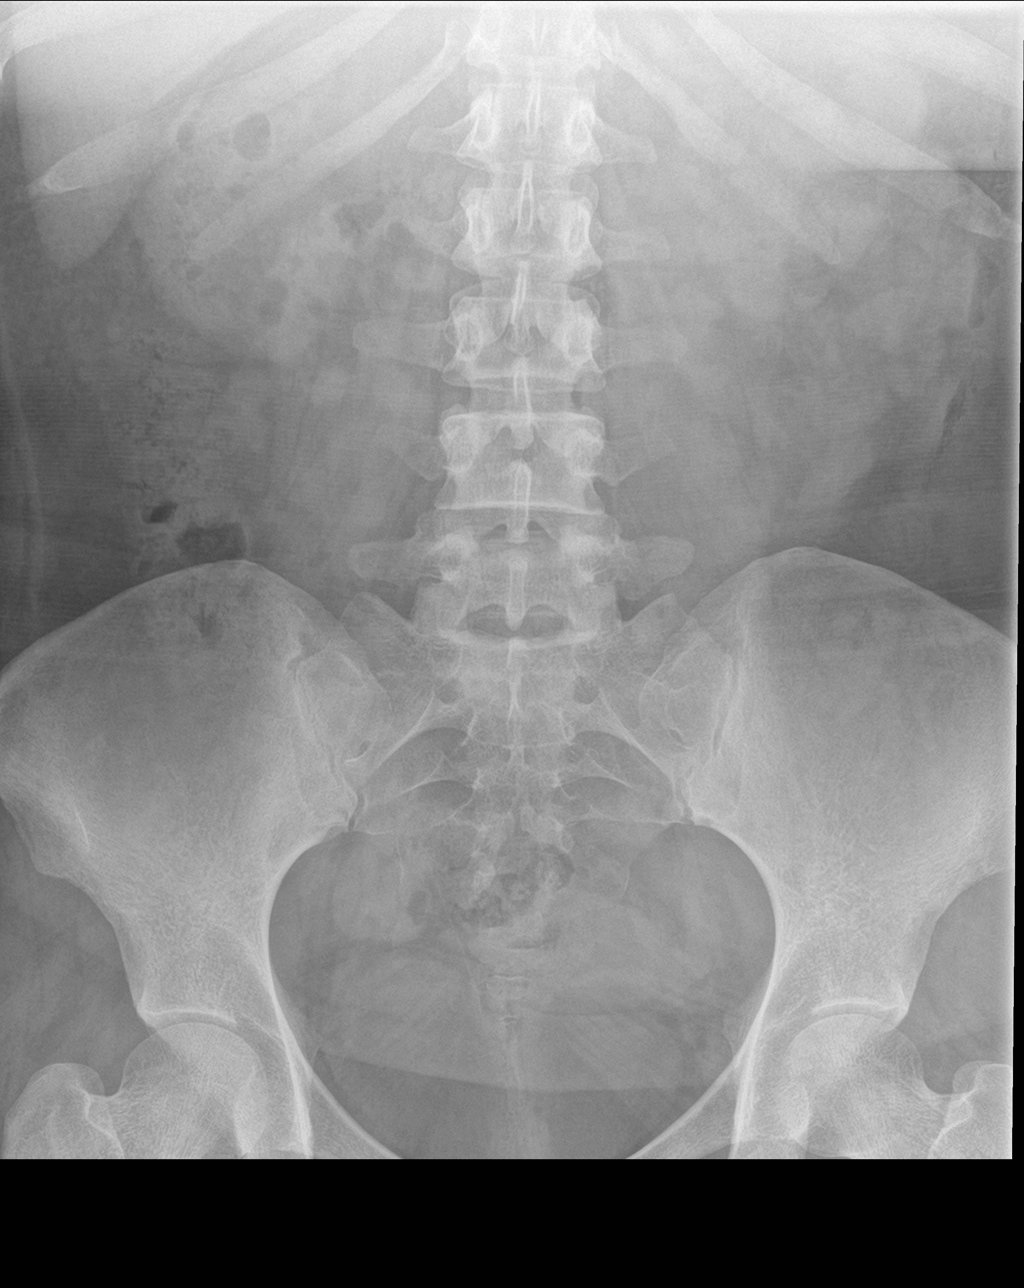

[abdomen supine (2 of 2)]
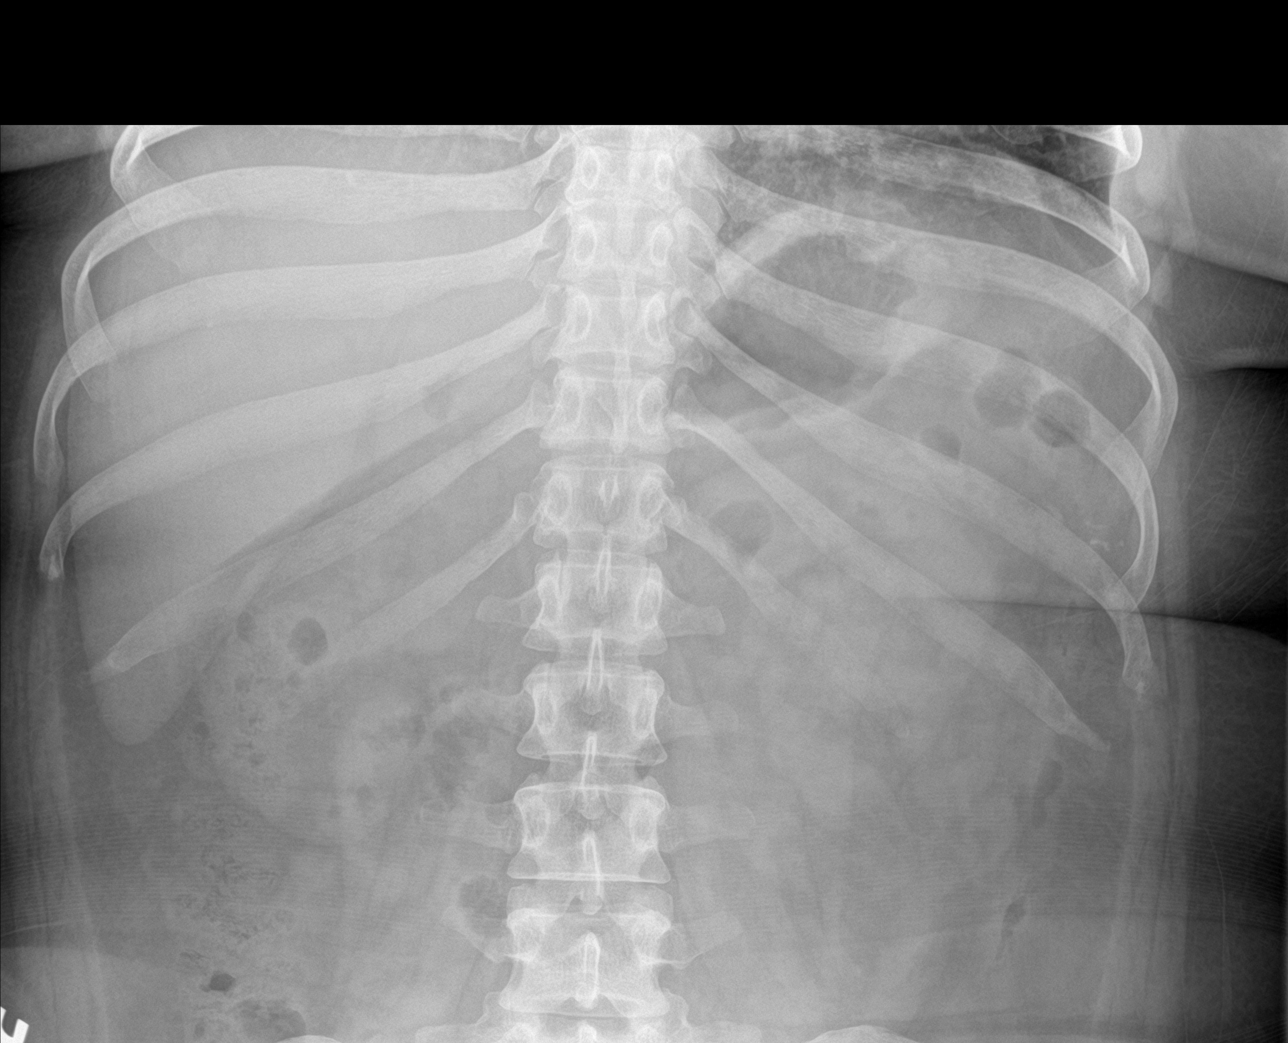

[3 of 3 positions shown; findings below may reference images not displayed]

FINDINGS: The bowel gas pattern is normal. There is no evidence of free air.
No radio-opaque calculi or other significant radiographic
abnormality is seen.
IMPRESSION: No evidence of bowel obstruction or ileus.

## 2020-08-06 ENCOUNTER — Other Ambulatory Visit: Payer: Self-pay

## 2020-08-06 DIAGNOSIS — R1011 Right upper quadrant pain: Secondary | ICD-10-CM | POA: Insufficient documentation

## 2020-08-06 DIAGNOSIS — K760 Fatty (change of) liver, not elsewhere classified: Secondary | ICD-10-CM | POA: Insufficient documentation

## 2020-08-06 DIAGNOSIS — R7401 Elevation of levels of liver transaminase levels: Secondary | ICD-10-CM | POA: Insufficient documentation

## 2020-08-06 LAB — COMPREHENSIVE METABOLIC PANEL
ALT: 110 U/L — ABNORMAL HIGH (ref 0–44)
AST: 74 U/L — ABNORMAL HIGH (ref 15–41)
Albumin: 4.1 g/dL (ref 3.5–5.0)
Alkaline Phosphatase: 67 U/L (ref 38–126)
Anion gap: 11 (ref 5–15)
BUN: 12 mg/dL (ref 6–20)
CO2: 21 mmol/L — ABNORMAL LOW (ref 22–32)
Calcium: 9 mg/dL (ref 8.9–10.3)
Chloride: 106 mmol/L (ref 98–111)
Creatinine, Ser: 0.47 mg/dL (ref 0.44–1.00)
GFR, Estimated: >60 mL/min (ref >60)
Glucose, Bld: 100 mg/dL — ABNORMAL HIGH (ref 70–99)
Potassium: 3.6 mmol/L (ref 3.5–5.1)
Sodium: 138 mmol/L (ref 135–145)
Total Bilirubin: 0.5 mg/dL (ref 0.3–1.2)
Total Protein: 8 g/dL (ref 6.5–8.1)

## 2020-08-06 LAB — CBC
HCT: 38.1 % (ref 36.0–46.0)
Hemoglobin: 12.5 g/dL (ref 12.0–15.0)
MCH: 29.6 pg (ref 26.0–34.0)
MCHC: 32.8 g/dL (ref 30.0–36.0)
MCV: 90.3 fL (ref 80.0–100.0)
Platelets: 331 10*3/uL (ref 150–400)
RBC: 4.22 MIL/uL (ref 3.87–5.11)
RDW: 13.6 % (ref 11.5–15.5)
WBC: 8.4 10*3/uL (ref 4.0–10.5)
nRBC: 0 % (ref 0.0–0.2)

## 2020-08-06 LAB — LIPASE, BLOOD: Lipase: 30 U/L (ref 11–51)

## 2020-08-06 NOTE — ED Triage Notes (Signed)
Pt to ER via POV with complaints of abdominal pain located around her navel x3 days. Reports pain radiates to the right and left side of her abdomen intermittently. Denies diarrhea, urinary symptoms, no NV. Reports currently on period.

## 2020-08-07 ENCOUNTER — Emergency Department: Payer: Self-pay

## 2020-08-07 ENCOUNTER — Emergency Department
Admission: EM | Admit: 2020-08-07 | Discharge: 2020-08-07 | Disposition: A | Discharge status: Home / Self Care | Payer: Self-pay | Attending: Emergency Medicine | Admitting: Emergency Medicine

## 2020-08-07 DIAGNOSIS — R7989 Other specified abnormal findings of blood chemistry: Secondary | ICD-10-CM

## 2020-08-07 DIAGNOSIS — R1010 Upper abdominal pain, unspecified: Secondary | ICD-10-CM

## 2020-08-07 DIAGNOSIS — K760 Fatty (change of) liver, not elsewhere classified: Secondary | ICD-10-CM

## 2020-08-07 LAB — HCG, QUANTITATIVE, PREGNANCY: hCG, Beta Chain, Quant, S: <1 m[IU]/mL (ref <5)

## 2020-08-07 MED ORDER — OMEPRAZOLE MAGNESIUM 20 MG PO TBEC
20.0000 mg | DELAYED_RELEASE_TABLET | Freq: Every day | ORAL | 1 refills | Status: AC
Start: 2020-08-07 — End: 2021-08-07

## 2020-08-07 MED ORDER — SUCRALFATE 1 G PO TABS: 1.0000 g | ORAL_TABLET | Freq: Four times a day (QID) | ORAL | 1 refills | Status: AC | PRN

## 2020-08-07 NOTE — ED Provider Notes (Signed)
[facility] Emergency Department Provider Note  ____________________________________________   Event Date/Time   First MD Initiated Contact with Patient 08/07/20 0144     (approximate)  I have reviewed the triage vital signs and the nursing notes.   HISTORY  Chief Complaint Abdominal Pain    HPI Shawna Russell is a 35 y.o. female with no chronic medical issues who presents for evaluation of abdominal pain over the last 24 hours.  She said that it is above her bellybutton.  It does not seem to be better or worse with eating.  She has had no nausea nor vomiting.  She describes it as an aching pain that is constant and radiates from the middle of her abdomen through to her back at times.  She is never had this before.  She is currently on her menstrual cycle but it does not feel like that type of pain.  She denies dysuria.  She denies fever/chills, sore throat, chest pain, shortness of breath, vomiting, and diarrhea.  Nothing in particular makes it better or worse.         History reviewed. No pertinent past medical history.  There are no problems to display for this patient.   History reviewed. No pertinent surgical history.  Prior to Admission medications   Medication Sig Start Date End Date Taking? Authorizing Provider  omeprazole (PRILOSEC OTC) 20 MG tablet Take 1 tablet (20 mg total) by mouth daily. 08/07/20 08/07/21 Yes Loleta Rose, MD  sucralfate (CARAFATE) 1 g tablet Take 1 tablet (1 g total) by mouth 4 (four) times daily as needed (for abdominal discomfort, nausea, and/or vomiting). 08/07/20  Yes Loleta Rose, MD  cephALEXin (KEFLEX) 500 MG capsule Take 1 capsule (500 mg total) by mouth 4 (four) times daily. Patient not taking: Reported on 06/12/2017 11/17/16   Sharyn Creamer, MD  HYDROcodone-acetaminophen (NORCO/VICODIN) 5-325 MG tablet Take 1 tablet by mouth every 6 (six) hours as needed for moderate pain. Patient not taking: Reported on  06/12/2017 11/17/16   Sharyn Creamer, MD  ondansetron (ZOFRAN ODT) 4 MG disintegrating tablet Take 1 tablet (4 mg total) by mouth every 6 (six) hours as needed for nausea or vomiting. Patient not taking: Reported on 06/12/2017 11/17/16   Sharyn Creamer, MD  ranitidine (ZANTAC 75) 75 MG tablet Take 1 tablet (75 mg total) by mouth 2 (two) times daily. Patient not taking: Reported on 06/12/2017 05/30/16   Myrna Blazer, MD  omeprazole (PRILOSEC) 20 MG capsule Take 20 mg by mouth daily.  08/07/20  [provider]    Allergies Patient has no known allergies.  No family history on file.  Social History Social History   Tobacco Use  . Smoking status: Never Smoker  . Smokeless tobacco: Never Used  Substance Use Topics  . Alcohol use: No    Comment: occ    Review of Systems Constitutional: No fever/chills Eyes: No visual changes. ENT: No sore throat. Cardiovascular: Denies chest pain. Respiratory: Denies shortness of breath. Gastrointestinal: Positive for abdominal pain.  Negative for vomiting. Genitourinary: Negative for dysuria. Musculoskeletal: Negative for neck pain.  Negative for back pain. Integumentary: Negative for rash. Neurological: Negative for headaches, focal weakness or numbness.   ____________________________________________   PHYSICAL EXAM:  VITAL SIGNS: ED Triage Vitals  Enc Vitals Group     BP 08/06/20 1853 140/80     Pulse Rate 08/06/20 1853 91     Resp 08/06/20 1853 16     Temp 08/06/20 1853 98.7  F (37.1 C)     Temp Source 08/06/20 1853 Oral     SpO2 08/06/20 1853 98 %     Weight 08/06/20 1854 99.8 kg (220 lb)     Height 08/06/20 1854 1.549 m (5\' 1" )     Head Circumference --      Peak Flow --      Pain Score 08/06/20 1854 8     Pain Loc --      Pain Edu? --      Excl. in GC? --     Constitutional: Alert and oriented.  Eyes: Conjunctivae are normal.  Head: Atraumatic. Nose: No congestion/rhinnorhea. Mouth/Throat: Patient is wearing a  mask. Neck: No stridor.  No meningeal signs.   Cardiovascular: Normal rate, regular rhythm. Good peripheral circulation. Respiratory: Normal respiratory effort.  No retractions. Gastrointestinal:  tenderness to palpation of the epigastrium.  Equivocal right upper quadrant tenderness.  No lower abdominal tenderness and no localized peritonitis. Musculoskeletal: No lower extremity tenderness nor edema. No gross deformities of extremities. Neurologic:  Normal speech and language. No gross focal neurologic deficits are appreciated.  Skin:  Skin is warm, dry and intact. Psychiatric: Mood and affect are normal. Speech and behavior are normal.  ____________________________________________   LABS (all labs ordered are listed, but only abnormal results are displayed)  Labs Reviewed  COMPREHENSIVE METABOLIC PANEL - Abnormal; Notable for the following components:      Result Value   CO2 21 (*)    Glucose, Bld 100 (*)    AST 74 (*)    ALT 110 (*)    All other components within normal limits  LIPASE, BLOOD  CBC  HCG, QUANTITATIVE, PREGNANCY  URINALYSIS, COMPLETE (UACMP) WITH MICROSCOPIC  POC URINE PREG, ED   ____________________________________________  EKG  No indication for emergent EKG ____________________________________________  RADIOLOGY I, 08/08/20, personally viewed and evaluated these images (plain radiographs) as part of my medical decision making, as well as reviewing the written report by the radiologist.  ED MD interpretation:  Hepatic steatosis, but no evidence of gallbladder disease  Official radiology report(s): Loleta Rose ABDOMEN LIMITED RUQ (LIVER/GB)  Result Date: 08/07/2020 CLINICAL DATA:  Mid abdominal and back pain for 4 days EXAM: ULTRASOUND ABDOMEN LIMITED RIGHT UPPER QUADRANT COMPARISON:  None. FINDINGS: Gallbladder: No gallstones or wall thickening visualized. No sonographic Murphy sign noted by sonographer. Common bile duct: Diameter: 3.7 mm Liver: Diffusely  increased hepatic echogenicity with loss of definition of the portal triads and diminished posterior through transmission compatible with hepatic steatosis. No focal liver lesion. No intrahepatic biliary ductal dilatation. Smooth liver surface contour. Portal vein is patent on color Doppler imaging with normal direction of blood flow towards the liver. Other: None. IMPRESSION: Sonographic appearance of the liver most often compatible with hepatic steatosis. Otherwise unremarkable right upper quadrant ultrasound. Electronically Signed   By: 08/09/2020 M.D.   On: 08/07/2020 02:57    ____________________________________________   PROCEDURES   Procedure(s) performed (including Critical Care):  Procedures   ____________________________________________   INITIAL IMPRESSION / MDM / ASSESSMENT AND PLAN / ED COURSE  As part of my medical decision making, I reviewed the following data within the electronic MEDICAL RECORD NUMBER Nursing notes reviewed and incorporated, Labs reviewed , Old chart reviewed and Notes from prior ED visits   Differential diagnosis includes, but is not limited to, biliary colic, pancreatitis, acid reflux, gastritis, renal colic, menstrual cycle pain.  The patient's labs are notable for mild AST and ALT elevation.  She  does not drink alcohol and has no other new medications recently.  Her T bili is normal.  Her lipase is normal.  No leukocytosis.  Otherwise normal comprehensive metabolic panel.  Serum hCG is negative.  I suspect gallbladder disease.  I explained this to the patient and a right upper quadrant ultrasound is pending.  She understands and agrees with the plan.  No severe pain at this time.     Clinical Course as of 08/07/20 0336  Sat Aug 07, 2020  0330 Ultrasound generally reassuring.  No evidence of gallbladder disease.  Patient has hepatic steatosis but no acute or emergent abnormality.  I talked with her about the results.  I am writing prescription for  Prilosec and Carafate and I strongly encouraged her to follow-up with gastroenterology.  I provided contact information.  She understands and agrees with the plan.  I gave my usual and customary return precautions. [CF]    Clinical Course User Index [CF] Loleta Rose, MD     ____________________________________________  FINAL CLINICAL IMPRESSION(S) / ED DIAGNOSES  Final diagnoses:  Upper abdominal pain  Elevated LFTs  Hepatic steatosis     MEDICATIONS GIVEN DURING THIS VISIT:  Medications - No data to display   ED Discharge Orders         Ordered    omeprazole (PRILOSEC OTC) 20 MG tablet  Daily        08/07/20 0335    sucralfate (CARAFATE) 1 g tablet  4 times daily PRN        08/07/20 0335          *Please note:  Taniaya Rudder was evaluated in Emergency Department on 08/07/2020 for the symptoms described in the history of present illness. She was evaluated in the context of the global COVID-19 pandemic, which necessitated consideration that the patient might be at risk for infection with the SARS-CoV-2 virus that causes COVID-19. Institutional protocols and algorithms that pertain to the evaluation of patients at risk for COVID-19 are in a state of rapid change based on information released by regulatory bodies including the CDC and federal and state organizations. These policies and algorithms were followed during the patient's care in the ED.  Some ED evaluations and interventions may be delayed as a result of limited staffing during and after the pandemic.*  Note:  This document was prepared using Dragon voice recognition software and may include unintentional dictation errors.   Loleta Rose, MD 08/07/20 [phone]

## 2020-08-07 NOTE — Discharge Instructions (Signed)
As we discussed, your work-up was generally reassuring, but you do have slightly elevated liver function tests and your ultrasound suggest you have the beginning of what is known as "fatty liver disease", or hepatic steatosis.  Given your ongoing discomfort, we strongly encourage you to follow-up with a gastroenterologist such as Dr. Tobi Bastos or one of his colleagues at the next available opportunity.  The gastroenterologist may be able to recommend additional evaluation and treatment.  In the meantime try taking the medications prescribed today and see if it helps.    Return to the emergency department if you develop new or worsening symptoms that concern you.

## 2023-08-22 ENCOUNTER — Other Ambulatory Visit: Payer: Self-pay | Admitting: Family Medicine

## 2023-08-22 DIAGNOSIS — N6322 Unspecified lump in the left breast, upper inner quadrant: Secondary | ICD-10-CM

## 2023-08-23 ENCOUNTER — Other Ambulatory Visit: Payer: Self-pay

## 2023-08-23 DIAGNOSIS — N6320 Unspecified lump in the left breast, unspecified quadrant: Secondary | ICD-10-CM

## 2023-09-17 ENCOUNTER — Ambulatory Visit
Admission: RE | Admit: 2023-09-17 | Discharge: 2023-09-17 | Disposition: A | Discharge status: Home / Self Care | Payer: Self-pay | Source: Ambulatory Visit | Attending: Obstetrics and Gynecology | Admitting: Obstetrics and Gynecology

## 2023-09-17 ENCOUNTER — Ambulatory Visit: Payer: Self-pay | Attending: Obstetrics and Gynecology | Admitting: *Deleted

## 2023-09-17 ENCOUNTER — Other Ambulatory Visit: Payer: Self-pay | Admitting: Obstetrics and Gynecology

## 2023-09-17 VITALS — BP 137/95 | Wt 220.2 lb

## 2023-09-17 DIAGNOSIS — Z1239 Encounter for other screening for malignant neoplasm of breast: Secondary | ICD-10-CM

## 2023-09-17 DIAGNOSIS — N6310 Unspecified lump in the right breast, unspecified quadrant: Secondary | ICD-10-CM

## 2023-09-17 DIAGNOSIS — N6320 Unspecified lump in the left breast, unspecified quadrant: Secondary | ICD-10-CM

## 2023-09-17 DIAGNOSIS — N644 Mastodynia: Secondary | ICD-10-CM

## 2023-09-17 NOTE — Progress Notes (Signed)
 [name] is a 38 y.o. No obstetric history on file. female who presents to [facility] clinic today with complaint of left breast lump x one month that has decreased in size and is painful when touched. Patient rates the pain at a 8 out of 10. Patient stated that the she could not feel the lump today. .    Pap Smear: Pap smear not completed today. Last Pap smear was over 5 years ago and was normal per patient. Per patient has no history of an abnormal Pap smear. Last Pap smear result is not available in Epic.   Physical exam: Breasts Breasts symmetrical. No skin abnormalities bilateral breasts. No nipple retraction bilateral breasts. No nipple discharge bilateral breasts. No lymphadenopathy. No lumps palpated bilateral breasts. Unable to palpate a lump in patients area of concern within the left breast. Complaints of left upper outer breast pain on exam.       Pelvic/Bimanual Pap smear is due today. Patient smear not completed due to patient is currently on menstrual period. Will have one of the [facility] schedulers call patient to reschedule Pap smear.   Smoking History: Patient has never smoked.   Patient Navigation: Patient education provided. Access to services provided for patient through [employer] program. Spanish interpreter Shawna Russell from [facility] provided.   Breast and Cervical Cancer Risk Assessment: Patient does not have family history of breast cancer, known genetic mutations, or radiation treatment to the chest before age 62. Patient does not have history of cervical dysplasia, immunocompromised, or DES exposure in-utero.  Risk Scores as of Encounter on 09/17/2023     Shawna Russell           5-year 0.27%   Lifetime 8.17%   Because the patient's race/ethnicity is unknown, the model is using data for white patients and might not be accurate.         Last calculated by Shawna Ingle, LPN on 04/15/1094 at 12:56 PM        A: [facility] exam without pap smear Complaint of  left breast lump and pain.  P: Referred patient to the [facility] for a diagnostic mammogram. Appointment scheduled Monday, September 17, 2023 at 1340.  Shawna Edge, RN 09/17/2023 12:27 PM

## 2023-09-17 NOTE — Patient Instructions (Signed)
 Explained breast self awareness with [name]. Pap smear is due today. Patient smear not completed due to patient is currently on menstrual period. Will have one of the [facility] schedulers call patient to reschedule Pap smear. Let her know [facility] will cover Pap smears and HPV typing every 5 years unless has a history of abnormal Pap smears. Referred patient to the [facility] for a diagnostic mammogram. Appointment scheduled Monday, September 17, 2023 at 1340. Patient aware of appointment and will be there. [name] verbalized understanding.  [name], Dela Favor, RN 12:27 PM

## 2023-09-20 ENCOUNTER — Other Ambulatory Visit: Payer: Self-pay | Admitting: Obstetrics and Gynecology

## 2023-09-20 DIAGNOSIS — R928 Other abnormal and inconclusive findings on diagnostic imaging of breast: Secondary | ICD-10-CM

## 2023-09-25 ENCOUNTER — Ambulatory Visit: Payer: Self-pay | Admitting: Obstetrics and Gynecology

## 2023-09-25 DIAGNOSIS — R928 Other abnormal and inconclusive findings on diagnostic imaging of breast: Secondary | ICD-10-CM

## 2023-09-28 ENCOUNTER — Ambulatory Visit
Admission: RE | Admit: 2023-09-28 | Discharge: 2023-09-28 | Disposition: A | Discharge status: Home / Self Care | Payer: Self-pay | Source: Ambulatory Visit | Attending: Obstetrics and Gynecology | Admitting: Obstetrics and Gynecology

## 2023-09-28 DIAGNOSIS — R928 Other abnormal and inconclusive findings on diagnostic imaging of breast: Secondary | ICD-10-CM

## 2023-09-28 HISTORY — PX: BREAST BIOPSY: SHX20

## 2023-09-28 MED ORDER — LIDOCAINE-EPINEPHRINE 1 %-1:100000 IJ SOLN
8.0000 mL | Freq: Once | INTRAMUSCULAR | Status: AC
Start: 2023-09-28 — End: 2023-09-28
  Administered 2023-09-28: 8 mL
  Filled 2023-09-28: qty 8

## 2023-09-28 MED ORDER — LIDOCAINE-EPINEPHRINE 1 %-1:100000 IJ SOLN
8.0000 mL | Freq: Once | INTRAMUSCULAR | Status: AC
  Administered 2023-09-28: 8 mL
  Filled 2023-09-28: qty 8

## 2023-09-28 MED ORDER — LIDOCAINE 1 % OPTIME INJ - NO CHARGE
2.0000 mL | Freq: Once | INTRAMUSCULAR | Status: AC
Start: 2023-09-28 — End: 2023-09-28
  Administered 2023-09-28: 2 mL
  Filled 2023-09-28: qty 2

## 2023-09-28 MED ORDER — LIDOCAINE 1 % OPTIME INJ - NO CHARGE
2.0000 mL | Freq: Once | INTRAMUSCULAR | Status: AC
  Administered 2023-09-28: 2 mL
  Filled 2023-09-28: qty 2

## 2023-10-02 LAB — SURGICAL PATHOLOGY

## 2023-11-19 ENCOUNTER — Ambulatory Visit: Payer: Self-pay
